# Patient Record
Sex: Female | Born: 1995 | Race: White | Hispanic: No | Marital: Single | Smoking: Never smoker
Health system: Southern US, Community
[De-identification: ages and names within clinical notes are randomized; demographics above are authoritative.]

## PROBLEM LIST (undated history)

## (undated) DIAGNOSIS — G90A Postural orthostatic tachycardia syndrome (POTS): Secondary | ICD-10-CM

## (undated) DIAGNOSIS — D689 Coagulation defect, unspecified: Secondary | ICD-10-CM

## (undated) DIAGNOSIS — I498 Other specified cardiac arrhythmias: Secondary | ICD-10-CM

## (undated) HISTORY — PX: TONSILLECTOMY: SUR1361

---
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
## 2020-01-09 ENCOUNTER — Ambulatory Visit: Payer: Self-pay | Attending: Internal Medicine

## 2020-01-09 DIAGNOSIS — Z23 Encounter for immunization: Secondary | ICD-10-CM

## 2020-01-09 NOTE — Progress Notes (Signed)
   Covid-19 Vaccination Clinic  Name:  Nevaya Nagele    MRN: [id number] DOB: Apr 07, 1996  01/09/2020  Ms. Kanitz was observed post Covid-19 immunization for 30 minutes based on pre-vaccination screening without incident. She was provided with Vaccine Information Sheet and instruction to access the V-Safe system.   Ms. Leugers was instructed to call 911 with any severe reactions post vaccine: Marland Kitchen Difficulty breathing  . Swelling of face and throat  . A fast heartbeat  . A bad rash all over body  . Dizziness and weakness   Immunizations Administered    Name Date Dose VIS Date Route   Pfizer COVID-19 Vaccine 01/09/2020  4:32 PM 0.3 mL 10/16/2018 Intramuscular   Manufacturer: [employer], [employer]   Lot: [id number]   NDC: [address]-3

## 2020-02-03 ENCOUNTER — Ambulatory Visit: Payer: Self-pay

## 2020-02-10 ENCOUNTER — Ambulatory Visit: Payer: BC Managed Care – PPO | Attending: Internal Medicine

## 2020-02-10 DIAGNOSIS — Z23 Encounter for immunization: Secondary | ICD-10-CM

## 2020-02-10 NOTE — Progress Notes (Signed)
   Covid-19 Vaccination Clinic  Name:  Meredith Beasley    MRN: [id number] DOB: May 09, 1996  02/10/2020  [name] was observed post Covid-19 immunization for 15 minutes without incident. She was provided with Vaccine Information Sheet and instruction to access the V-Safe system.   [name] was instructed to call 911 with any severe reactions post vaccine: Marland Kitchen Difficulty breathing  . Swelling of face and throat  . A fast heartbeat  . A bad rash all over body  . Dizziness and weakness   Immunizations Administered    Name Date Dose VIS Date Route   Pfizer COVID-19 Vaccine 02/10/2020  2:05 PM 0.3 mL 10/16/2018 Intramuscular   Manufacturer: [employer], [employer]   Lot: [id number]   NDC: [address]-8

## 2021-03-26 ENCOUNTER — Other Ambulatory Visit: Payer: Self-pay

## 2021-03-26 ENCOUNTER — Encounter (HOSPITAL_BASED_OUTPATIENT_CLINIC_OR_DEPARTMENT_OTHER): Payer: Self-pay | Admitting: *Deleted

## 2021-03-26 ENCOUNTER — Emergency Department (HOSPITAL_BASED_OUTPATIENT_CLINIC_OR_DEPARTMENT_OTHER): Payer: BC Managed Care – PPO

## 2021-03-26 ENCOUNTER — Other Ambulatory Visit (HOSPITAL_BASED_OUTPATIENT_CLINIC_OR_DEPARTMENT_OTHER): Payer: Self-pay

## 2021-03-26 ENCOUNTER — Emergency Department (HOSPITAL_BASED_OUTPATIENT_CLINIC_OR_DEPARTMENT_OTHER)
Admission: EM | Admit: 2021-03-26 | Discharge: 2021-03-26 | Disposition: A | Discharge status: Home / Self Care | Payer: BC Managed Care – PPO | Attending: Emergency Medicine | Admitting: Emergency Medicine

## 2021-03-26 DIAGNOSIS — R079 Chest pain, unspecified: Secondary | ICD-10-CM | POA: Diagnosis present

## 2021-03-26 DIAGNOSIS — U071 COVID-19: Secondary | ICD-10-CM | POA: Insufficient documentation

## 2021-03-26 DIAGNOSIS — Z7901 Long term (current) use of anticoagulants: Secondary | ICD-10-CM | POA: Diagnosis not present

## 2021-03-26 DIAGNOSIS — R072 Precordial pain: Secondary | ICD-10-CM | POA: Insufficient documentation

## 2021-03-26 HISTORY — DX: Coagulation defect, unspecified: D68.9

## 2021-03-26 HISTORY — DX: Other specified cardiac arrhythmias: I49.8

## 2021-03-26 HISTORY — DX: Postural orthostatic tachycardia syndrome (POTS): G90.A

## 2021-03-26 LAB — COMPREHENSIVE METABOLIC PANEL
ALT: 23 U/L (ref 0–44)
AST: 23 U/L (ref 15–41)
Albumin: 4.1 g/dL (ref 3.5–5.0)
Alkaline Phosphatase: 57 U/L (ref 38–126)
Anion gap: 6 (ref 5–15)
BUN: 9 mg/dL (ref 6–20)
CO2: 24 mmol/L (ref 22–32)
Calcium: 9.1 mg/dL (ref 8.9–10.3)
Chloride: 109 mmol/L (ref 98–111)
Creatinine, Ser: 0.91 mg/dL (ref 0.44–1.00)
GFR, Estimated: >60 mL/min (ref >60)
Glucose, Bld: 103 mg/dL — ABNORMAL HIGH (ref 70–99)
Potassium: 3.9 mmol/L (ref 3.5–5.1)
Sodium: 139 mmol/L (ref 135–145)
Total Bilirubin: 0.5 mg/dL (ref 0.3–1.2)
Total Protein: 7.7 g/dL (ref 6.5–8.1)

## 2021-03-26 LAB — CBC WITH DIFFERENTIAL/PLATELET
Abs Immature Granulocytes: 0.05 10*3/uL (ref 0.00–0.07)
Basophils Absolute: 0.1 10*3/uL (ref 0.0–0.1)
Basophils Relative: 1 %
Eosinophils Absolute: 0.2 10*3/uL (ref 0.0–0.5)
Eosinophils Relative: 2 %
HCT: 38.4 % (ref 36.0–46.0)
Hemoglobin: 12.2 g/dL (ref 12.0–15.0)
Immature Granulocytes: 0 %
Lymphocytes Relative: 25 %
Lymphs Abs: 2.9 10*3/uL (ref 0.7–4.0)
MCH: 24.3 pg — ABNORMAL LOW (ref 26.0–34.0)
MCHC: 31.8 g/dL (ref 30.0–36.0)
MCV: 76.3 fL — ABNORMAL LOW (ref 80.0–100.0)
Monocytes Absolute: 1.1 10*3/uL — ABNORMAL HIGH (ref 0.1–1.0)
Monocytes Relative: 10 %
Neutro Abs: 7.2 10*3/uL (ref 1.7–7.7)
Neutrophils Relative %: 62 %
Platelets: 373 10*3/uL (ref 150–400)
RBC: 5.03 MIL/uL (ref 3.87–5.11)
RDW: 15.1 % (ref 11.5–15.5)
WBC: 11.6 10*3/uL — ABNORMAL HIGH (ref 4.0–10.5)
nRBC: 0 % (ref 0.0–0.2)

## 2021-03-26 LAB — HCG, SERUM, QUALITATIVE: Preg, Serum: NEGATIVE

## 2021-03-26 LAB — TROPONIN I (HIGH SENSITIVITY): Troponin I (High Sensitivity): <2 ng/L (ref <18)

## 2021-03-26 MED ORDER — IOHEXOL 350 MG/ML SOLN
100.0000 mL | Freq: Once | INTRAVENOUS | Status: AC | PRN
  Administered 2021-03-26: 70 mL via INTRAVENOUS

## 2021-03-26 MED ORDER — PREDNISONE 10 MG PO TABS
20.0000 mg | ORAL_TABLET | Freq: Every day | ORAL | 0 refills | Status: AC
  Filled 2021-03-26: qty 10, 5d supply, fill #0

## 2021-03-26 NOTE — ED Notes (Signed)
Patient transported to CT 

## 2021-03-26 NOTE — ED Provider Notes (Signed)
[facility] EMERGENCY DEPARTMENT Provider Note   CSN: [id number] Arrival date & time: 03/26/21  1351     History Chest pain   Meredith Beasley is a 25 y.o. 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 Pain x 1 week. Tested positive for covid 2 days ago. No current SOB. Feels anxious. No missed doses of anticoag. No fever, chills, cough, abd pain, unilateral leg swelling redness ,warmth. Denies additional aggravating or alleviating factors.  History obtained from patient and past medical records. No interpretor was used.  HPI     Past Medical History:  Diagnosis Date   Clotting disorder (HCC)    POTS (postural orthostatic tachycardia syndrome)     There are no problems to display for this patient.   Past Surgical History:  Procedure Laterality Date   TONSILLECTOMY       OB History   No obstetric history on file.     No family history on file.  Social History   Tobacco Use   Smoking status: Never   Smokeless tobacco: Never  Vaping Use   Vaping Use: Never used  Substance Use Topics   Alcohol use: Never   Drug use: Never    Home Medications Prior to Admission medications   Medication Sig Start Date End Date Taking? Authorizing Provider  apixaban (ELIQUIS) 5 MG TABS tablet Take by mouth. 01/28/21  Yes [provider]  busPIRone (BUSPAR) 15 MG tablet Take 1 tablet by mouth 2 (two) times daily. 03/11/21  Yes [provider]  escitalopram (LEXAPRO) 20 MG tablet Take by mouth. 07/03/19  Yes [provider]  predniSONE (DELTASONE) 10 MG tablet Take 2 tablets (20 mg total) by mouth daily for 5 days. 03/26/21 03/31/21 Yes Terriann Difonzo A, PA-C  medroxyPROGESTERone Acetate 150 MG/ML SUSY INJECT 1 ML INTRAMUSCULARLY EVERY 3 MONTHS 02/08/21   [provider]    Allergies    Sumatriptan  Review of Systems   Review of Systems   Constitutional: Negative.   HENT: Negative.    Respiratory: Negative.    Cardiovascular:  Positive for chest pain. Negative for palpitations and leg swelling.  Genitourinary: Negative.   Musculoskeletal: Negative.   Skin: Negative.   Neurological: Negative.   All other systems reviewed and are negative.  Physical Exam Updated Vital Signs BP 104/88 (BP Location: Right Arm) Comment: Simultaneous filing. User may not have seen previous data.  Pulse 78 Comment: Simultaneous filing. User may not have seen previous data.  Temp 98.8 F (37.1 C) (Oral)   Resp 17 Comment: Simultaneous filing. User may not have seen previous data.  Ht 5\' 7"  (1.702 m)   Wt 131.5 kg   SpO2 99% Comment: Simultaneous filing. User may not have seen previous data.  BMI 45.42 kg/m   Physical Exam Vitals and nursing note reviewed.  Constitutional:      General: She is not in acute distress.    Appearance: She is well-developed. She is obese. She is not ill-appearing, toxic-appearing or diaphoretic.  HENT:     Head: Normocephalic and atraumatic.     Nose: Nose normal.  Eyes:     Pupils: Pupils are equal, round, and reactive to light.  Cardiovascular:     Rate and Rhythm: Normal rate.     Pulses: Normal pulses.     Heart sounds: Normal heart sounds.  Pulmonary:     Effort: Pulmonary effort is  normal. No respiratory distress.     Breath sounds: Normal breath sounds.  Chest:    Abdominal:     General: Bowel sounds are normal. There is no distension.     Palpations: Abdomen is soft.  Musculoskeletal:        General: No swelling, tenderness, deformity or signs of injury. Normal range of motion.     Cervical back: Normal range of motion.     Right lower leg: No edema.     Left lower leg: No edema.  Skin:    General: Skin is warm and dry.     Capillary Refill: Capillary refill takes less than 2 seconds.  Neurological:     General: No focal deficit present.     Mental Status: She is alert and oriented  to person, place, and time.  Psychiatric:        Mood and Affect: Mood is anxious.    ED Results / Procedures / Treatments   Labs (all labs ordered are listed, but only abnormal results are displayed) Labs Reviewed  CBC WITH DIFFERENTIAL/PLATELET - Abnormal; Notable for the following components:      Result Value   WBC 11.6 (*)    MCV 76.3 (*)    MCH 24.3 (*)    Monocytes Absolute 1.1 (*)    All other components within normal limits  COMPREHENSIVE METABOLIC PANEL - Abnormal; Notable for the following components:   Glucose, Bld 103 (*)    All other components within normal limits  HCG, SERUM, QUALITATIVE  TROPONIN I (HIGH SENSITIVITY)  TROPONIN I (HIGH SENSITIVITY)    EKG EKG Interpretation  Date/Time:  Friday March 26 2021 14:06:34 EDT Ventricular Rate:  90 PR Interval:  138 QRS Duration: 84 QT Interval:  340 QTC Calculation: 415 R Axis:   59 Text Interpretation: Normal sinus rhythm Nonspecific T wave abnormality Abnormal ECG Confirmed by Virgina Norfolk ([id number]) on 03/26/2021 2:07:46 PM  Radiology CT Angio Chest PE W/Cm &/Or Wo Cm  Result Date: 03/26/2021 CLINICAL DATA:  25 year old female with factor 5 Leiden coagulation disorder, COVID positive with pleuritic chest pain. High probability for pulmonary embolus. EXAM: CT ANGIOGRAPHY CHEST WITH CONTRAST TECHNIQUE: Multidetector CT imaging of the chest was performed using the standard protocol during bolus administration of intravenous contrast. Multiplanar CT image reconstructions and MIPs were obtained to evaluate the vascular anatomy. CONTRAST:  69mL OMNIPAQUE IOHEXOL 350 MG/ML SOLN COMPARISON:  None. FINDINGS: Cardiovascular: Satisfactory opacification of the pulmonary arteries to the segmental level. No evidence of pulmonary embolism. Normal heart size. No pericardial effusion. Mediastinum/Nodes: No enlarged mediastinal, hilar, or axillary lymph nodes. Thyroid gland, trachea, and esophagus demonstrate no significant findings.  Lungs/Pleura: Lungs are clear. No pleural effusion or pneumothorax. Upper Abdomen: No acute abnormality. Musculoskeletal: No chest wall abnormality. No acute or significant osseous findings. Review of the MIP images confirms the above findings. IMPRESSION: Negative for acute pulmonary embolus, pneumonia or other acute cardiopulmonary process. Electronically Signed   By: Malachy Moan M.D.   On: 03/26/2021 15:51    Procedures Procedures   Medications Ordered in ED Medications  iohexol (OMNIPAQUE) 350 MG/ML injection 100 mL (70 mLs Intravenous Contrast Given 03/26/21 1519)    ED Course  I have reviewed the triage vital signs and the nursing notes.  Pertinent labs & imaging results that were available during my care of the patient were reviewed by me and considered in my medical decision making (see chart for details).  Here for evaluation of left-sided chest pain  over the last week.  She is afebrile, nonseptic, non-ill-appearing.  History of factor V Leiden. Recent pos COVID 2 days ago. NV intact. Has not missed any doses of anticoag. No obvious DVT on exam. Heart and lungs clear. Abd soft.  Does appear anxious.  Labs and imaging personally reviewed and interpreted: CBC leuks at 11.6 CMP glucose 103 Trop <2 Preg neg EKG without ischemic changes CTA chest without ischemic changes  Patient reassessed.  No respiratory distress.  Appears overall well.  Question costochondritis versus pleurisy.  Reassuring work-up here in ED.  Discussed continued quarantine at home for COVID infection.  Strict return precautions.  Return for new or worsening symptoms.  The patient has been appropriately medically screened and/or stabilized in the ED. I have low suspicion for any other emergent medical condition which would require further screening, evaluation or treatment in the ED or require inpatient management.  Patient is hemodynamically stable and in no acute distress.  Patient able to ambulate in  department prior to ED.  Evaluation does not show acute pathology that would require ongoing or additional emergent interventions while in the emergency department or further inpatient treatment.  I have discussed the diagnosis with the patient and answered all questions.  Pain is been managed while in the emergency department and patient has no further complaints prior to discharge.  Patient is comfortable with plan discussed in room and is stable for discharge at this time.  I have discussed strict return precautions for returning to the emergency department.  Patient was encouraged to follow-up with PCP/specialist refer to at discharge.     MDM Rules/Calculators/A&P                            Final Clinical Impression(s) / ED Diagnoses Final diagnoses:  Precordial pain    Rx / DC Orders ED Discharge Orders          Ordered    predniSONE (DELTASONE) 10 MG tablet  Daily        03/26/21 1719             Darric Plante A, PA-C 03/26/21 1721    Curatolo, Adam, DO 03/27/21 1027

## 2021-03-26 NOTE — ED Notes (Signed)
ED Provider at bedside. 

## 2021-03-26 NOTE — Discharge Instructions (Addendum)
Take the medications as prescribed  Return for new or worsneing symptoms

## 2021-03-26 NOTE — ED Triage Notes (Signed)
She tested positive for Covid 2 days ago. Here today with c.o stabbing chest pain to her left chest.

## 2021-07-09 ENCOUNTER — Other Ambulatory Visit: Payer: Self-pay

## 2021-07-09 ENCOUNTER — Emergency Department (HOSPITAL_BASED_OUTPATIENT_CLINIC_OR_DEPARTMENT_OTHER)
Admission: EM | Admit: 2021-07-09 | Discharge: 2021-07-09 | Disposition: A | Discharge status: Home / Self Care | Payer: BC Managed Care – PPO | Attending: Emergency Medicine | Admitting: Emergency Medicine

## 2021-07-09 ENCOUNTER — Emergency Department (HOSPITAL_BASED_OUTPATIENT_CLINIC_OR_DEPARTMENT_OTHER): Payer: BC Managed Care – PPO

## 2021-07-09 ENCOUNTER — Encounter (HOSPITAL_BASED_OUTPATIENT_CLINIC_OR_DEPARTMENT_OTHER): Payer: Self-pay | Admitting: Emergency Medicine

## 2021-07-09 DIAGNOSIS — R1031 Right lower quadrant pain: Secondary | ICD-10-CM | POA: Diagnosis present

## 2021-07-09 DIAGNOSIS — K760 Fatty (change of) liver, not elsewhere classified: Secondary | ICD-10-CM | POA: Insufficient documentation

## 2021-07-09 DIAGNOSIS — K529 Noninfective gastroenteritis and colitis, unspecified: Secondary | ICD-10-CM | POA: Diagnosis not present

## 2021-07-09 DIAGNOSIS — R161 Splenomegaly, not elsewhere classified: Secondary | ICD-10-CM | POA: Diagnosis not present

## 2021-07-09 DIAGNOSIS — K449 Diaphragmatic hernia without obstruction or gangrene: Secondary | ICD-10-CM | POA: Diagnosis not present

## 2021-07-09 DIAGNOSIS — Z7901 Long term (current) use of anticoagulants: Secondary | ICD-10-CM | POA: Insufficient documentation

## 2021-07-09 LAB — CBC WITH DIFFERENTIAL/PLATELET
Abs Immature Granulocytes: 0.05 10*3/uL (ref 0.00–0.07)
Basophils Absolute: 0.1 10*3/uL (ref 0.0–0.1)
Basophils Relative: 1 %
Eosinophils Absolute: 0.2 10*3/uL (ref 0.0–0.5)
Eosinophils Relative: 2 %
HCT: 38.4 % (ref 36.0–46.0)
Hemoglobin: 11.9 g/dL — ABNORMAL LOW (ref 12.0–15.0)
Immature Granulocytes: 1 %
Lymphocytes Relative: 24 %
Lymphs Abs: 2.6 10*3/uL (ref 0.7–4.0)
MCH: 23.2 pg — ABNORMAL LOW (ref 26.0–34.0)
MCHC: 31 g/dL (ref 30.0–36.0)
MCV: 75 fL — ABNORMAL LOW (ref 80.0–100.0)
Monocytes Absolute: 0.8 10*3/uL (ref 0.1–1.0)
Monocytes Relative: 8 %
Neutro Abs: 7.2 10*3/uL (ref 1.7–7.7)
Neutrophils Relative %: 64 %
Platelets: 324 10*3/uL (ref 150–400)
RBC: 5.12 MIL/uL — ABNORMAL HIGH (ref 3.87–5.11)
RDW: 14.4 % (ref 11.5–15.5)
WBC: 10.9 10*3/uL — ABNORMAL HIGH (ref 4.0–10.5)
nRBC: 0 % (ref 0.0–0.2)

## 2021-07-09 LAB — LIPASE, BLOOD: Lipase: 31 U/L (ref 11–51)

## 2021-07-09 LAB — COMPREHENSIVE METABOLIC PANEL
ALT: 24 U/L (ref 0–44)
AST: 25 U/L (ref 15–41)
Albumin: 4.3 g/dL (ref 3.5–5.0)
Alkaline Phosphatase: 68 U/L (ref 38–126)
Anion gap: 9 (ref 5–15)
BUN: 13 mg/dL (ref 6–20)
CO2: 22 mmol/L (ref 22–32)
Calcium: 9.3 mg/dL (ref 8.9–10.3)
Chloride: 105 mmol/L (ref 98–111)
Creatinine, Ser: 0.8 mg/dL (ref 0.44–1.00)
GFR, Estimated: >60 mL/min (ref >60)
Glucose, Bld: 95 mg/dL (ref 70–99)
Potassium: 3.7 mmol/L (ref 3.5–5.1)
Sodium: 136 mmol/L (ref 135–145)
Total Bilirubin: 0.7 mg/dL (ref 0.3–1.2)
Total Protein: 7.9 g/dL (ref 6.5–8.1)

## 2021-07-09 LAB — PREGNANCY, URINE: Preg Test, Ur: NEGATIVE

## 2021-07-09 MED ORDER — IOHEXOL 300 MG/ML  SOLN
100.0000 mL | Freq: Once | INTRAMUSCULAR | Status: AC | PRN
  Administered 2021-07-09: 100 mL via INTRAVENOUS

## 2021-07-09 MED ORDER — ONDANSETRON HCL 4 MG PO TABS: 4.0000 mg | ORAL_TABLET | ORAL | 0 refills | Status: DC | PRN

## 2021-07-09 MED ORDER — DICYCLOMINE HCL 20 MG PO TABS: 20.0000 mg | ORAL_TABLET | ORAL | 0 refills | Status: AC | PRN

## 2021-07-09 MED ORDER — AMOXICILLIN-POT CLAVULANATE 875-125 MG PO TABS: 1.0000 | ORAL_TABLET | Freq: Two times a day (BID) | ORAL | 0 refills | Status: DC

## 2021-07-09 MED ORDER — SODIUM CHLORIDE 0.9 % IV BOLUS
500.0000 mL | Freq: Once | INTRAVENOUS | Status: DC
Start: 2021-07-09 — End: 2021-07-09

## 2021-07-09 MED ORDER — TRAMADOL HCL 50 MG PO TABS: 50.0000 mg | ORAL_TABLET | ORAL | 0 refills | Status: AC | PRN

## 2021-07-09 MED ORDER — SODIUM CHLORIDE 0.9 % IV BOLUS: 1000.0000 mL | Freq: Once | INTRAVENOUS | Status: DC

## 2021-07-09 NOTE — ED Triage Notes (Signed)
Generalized abdominal pain x 3 days.  Worse in RLQ.  N/V/D x 3 days.  No known fever but some chills.  No dysuria.  Noted some bright red blood in stool.

## 2021-07-09 NOTE — ED Provider Notes (Signed)
[facility] Provider Note   CSN: [id number] Arrival date & time: 07/09/21  0947     History Chief Complaint  Patient presents with   Abdominal Pain    Meredith Beasley is a 25 y.o. female with medical history significant for factor V Leiden currently on Eliquis, who presents to the ED for chief complaint of abdominal pain for 3 days.  Patient states the pain is spread all over her abdomen, but is mostly focused in her right lower quadrant.  She states that the pain is consistent, describes it as sharp and states that lying on her stomach on her right side aggravates the pain.  Patient states her pain is a 5 out of 10.  Patient was diagnosed with appendicitis 2 years ago at a facility in the area, but after being treated with antibiotics the facility decided to forego removing her appendix.  Patient states her last menstrual period was 2 years ago, but adds that this is normal due to her being on Depakote shots.  Patient also adds that for the last month or 2 she has been having blood in her stool.  She states that her stools will be normal, but she will have an excessive amount of blood in her underwear after about an hour.  Patient states that she received a colonoscopy 2 years ago for a similar complaint of blood in her stool, but is unsure of what the colonoscopy revealed or if she received a diagnosis from this. Patient endorses fever, headaches, blood in stool, chills, night sweats, nausea, vomiting, diarrhea, decreased appetite.  Patient also endorses increased urination but states that she feels this is most likely due to her drinking lots of fluids to try and combat the vomiting and diarrhea. Patient denies any recent sexual activity, blood in urine, vaginal discharge, or painful urination.  Patient has family history of IBD, on her mothers side.   Abdominal Pain Associated symptoms: chills, diarrhea, fever, nausea and vomiting   Associated symptoms: no chest  pain, no dysuria, no hematuria, no shortness of breath and no vaginal discharge       Past Medical History:  Diagnosis Date   Clotting disorder (HCC)    POTS (postural orthostatic tachycardia syndrome)     There are no problems to display for this patient.   Past Surgical History:  Procedure Laterality Date   TONSILLECTOMY       OB History   No obstetric history on file.     History reviewed. No pertinent family history.  Social History   Tobacco Use   Smoking status: Never   Smokeless tobacco: Never  Vaping Use   Vaping Use: Never used  Substance Use Topics   Alcohol use: Never   Drug use: Never    Home Medications Prior to Admission medications   Medication Sig Start Date End Date Taking? Authorizing Provider  amoxicillin-clavulanate (AUGMENTIN) 875-125 MG tablet Take 1 tablet by mouth every 12 (twelve) hours. 07/09/21  Yes Azucena Cecil, PA  dicyclomine (BENTYL) 20 MG tablet Take 1 tablet (20 mg total) by mouth as needed for spasms (for stomach cramps). 07/09/21  Yes Azucena Cecil, PA  ondansetron (ZOFRAN) 4 MG tablet Take 1 tablet (4 mg total) by mouth as needed for nausea or vomiting. 07/09/21  Yes Azucena Cecil, PA  traMADol (ULTRAM) 50 MG tablet Take 1 tablet (50 mg total) by mouth as needed (for pain). 07/09/21  Yes Azucena Cecil, PA  apixaban Arne Cleveland)  5 MG TABS tablet Take by mouth. 01/28/21   [provider]  busPIRone (BUSPAR) 15 MG tablet Take 1 tablet by mouth 2 (two) times daily. 03/11/21   [provider]  escitalopram (LEXAPRO) 20 MG tablet Take by mouth. 07/03/19   [provider]  medroxyPROGESTERone Acetate 150 MG/ML SUSY INJECT 1 ML INTRAMUSCULARLY EVERY 3 MONTHS 02/08/21   [provider]    Allergies    Sumatriptan  Review of Systems   Review of Systems  Constitutional:  Positive for chills and fever.  Respiratory:  Negative for shortness of breath.   Cardiovascular:  Negative  for chest pain.  Gastrointestinal:  Positive for abdominal pain, blood in stool, diarrhea, nausea and vomiting.  Genitourinary:  Negative for decreased urine volume, difficulty urinating, dysuria, hematuria and vaginal discharge.  Neurological:  Positive for headaches.  All other systems reviewed and are negative.  Physical Exam Updated Vital Signs BP 119/70 (BP Location: Left Arm)   Pulse 78   Temp 98.7 F (37.1 C) (Oral)   Resp 18   Ht 5\' 7"  (1.702 m)   Wt 136.1 kg   SpO2 100%   BMI 46.99 kg/m   Physical Exam Vitals and nursing note reviewed.  Constitutional:      General: She is not in acute distress.    Appearance: She is not toxic-appearing.  HENT:     Head: Normocephalic.     Mouth/Throat:     Mouth: Mucous membranes are moist.  Eyes:     Extraocular Movements: Extraocular movements intact.     Pupils: Pupils are equal, round, and reactive to light.  Cardiovascular:     Rate and Rhythm: Normal rate and regular rhythm.  Pulmonary:     Effort: Pulmonary effort is normal.     Breath sounds: Normal breath sounds. No wheezing.  Abdominal:     General: Abdomen is flat. Bowel sounds are normal.     Palpations: Abdomen is soft.     Tenderness: There is abdominal tenderness in the right upper quadrant and right lower quadrant. There is no right CVA tenderness or left CVA tenderness. Positive signs include Murphy's sign, Rovsing's sign, McBurney's sign and obturator sign.  Skin:    General: Skin is warm and dry.     Capillary Refill: Capillary refill takes less than 2 seconds.  Neurological:     General: No focal deficit present.     Mental Status: She is alert.    ED Results / Procedures / Treatments   Labs (all labs ordered are listed, but only abnormal results are displayed) Labs Reviewed  CBC WITH DIFFERENTIAL/PLATELET - Abnormal; Notable for the following components:      Result Value   WBC 10.9 (*)    RBC 5.12 (*)    Hemoglobin 11.9 (*)    MCV 75.0 (*)     MCH 23.2 (*)    All other components within normal limits  COMPREHENSIVE METABOLIC PANEL  LIPASE, BLOOD  PREGNANCY, URINE    EKG None  Radiology CT ABDOMEN PELVIS W CONTRAST  Result Date: 07/09/2021 CLINICAL DATA:  Nonlocalized right-sided abdominal pain x3 days with nausea vomiting and diarrhea EXAM: CT ABDOMEN AND PELVIS WITH CONTRAST TECHNIQUE: Multidetector CT imaging of the abdomen and pelvis was performed using the standard protocol following bolus administration of intravenous contrast. CONTRAST:  155mL OMNIPAQUE IOHEXOL 300 MG/ML  SOLN COMPARISON:  MRI lumbar spine December 17, 2019 and chest CT March 26, 2021 FINDINGS: Lower chest: No acute abnormality.  Hepatobiliary: Mild diffuse hepatic steatosis with more focal area of fatty infiltration along the falciform ligament. Gallbladder is unremarkable. No biliary ductal dilation. No portal venous gas. Pancreas: No pancreatic ductal dilation or evidence of acute inflammation. Spleen: Mild splenomegaly measuring 14.8 cm in maximum axial dimension. Adrenals/Urinary Tract: Bilateral adrenal glands are unremarkable. No hydronephrosis. No solid enhancing renal mass. Kidneys demonstrate symmetric enhancement excretion of contrast. Urinary bladder is unremarkable for degree of distension. Stomach/Bowel: No enteric contrast was administered. Small hiatal hernia otherwise the stomach is unremarkable for degree of distension. No pathologic dilation of small or large bowel. Normal appendix. Colonic diverticulosis. Mild wall thickening of the ascending colon. Additionally there is mild wall thickening of predominantly decompressed left-sided colon extending from the splenic flexure to the rectum. No pneumatosis. Vascular/Lymphatic: No abdominal aortic aneurysm. Prominent mesenteric lymph nodes measuring up to 8 mm in short axis right lower quadrant. Reproductive: Uterus and bilateral adnexa are unremarkable. Other: No significant abdominopelvic free fluid.  Musculoskeletal: No acute or significant osseous findings. IMPRESSION: 1. Mild wall thickening of the ascending colon and predominantly decompressed left-sided colon extending from the splenic flexure to the rectum. Findings may represent infectious or inflammatory colitis. 2. Prominent mesenteric lymph nodes measuring up to 8 mm in short axis right lower quadrant, which may be reactive. 3. Normal appendix. 4. Colonic diverticulosis without evidence of acute diverticulitis. 5. Mild diffuse hepatic steatosis. 6. Mild splenomegaly. Electronically Signed   By: Dahlia Bailiff M.D.   On: 07/09/2021 11:29    Procedures Procedures   Medications Ordered in ED Medications  iohexol (OMNIPAQUE) 300 MG/ML solution 100 mL (100 mLs Intravenous Contrast Given 07/09/21 1112)    ED Course  I have reviewed the triage vital signs and the nursing notes.  Pertinent labs & imaging results that were available during my care of the patient were reviewed by me and considered in my medical decision making (see chart for details).    MDM Rules/Calculators/A&P                          25 year old female presents with right upper quadrant and right lower quadrant pain for 3 days.  On examination patient has positive Rovsing sign, McBurney point tenderness, positive obturator sign, and a positive Murphy sign.  Patient states that she has felt feverish but has not recorded a fever in the last couple days, has nausea and vomiting, and has a decreased appetite. The emergent DDX for RUQ/RLQ pain includes but is not limited to Gallbladder disease, appendicitis, IBD, PUD, Acute pancreatitis, pyelonephritis.  On examination patient is afebrile, nonseptic, nontoxic appearing, nonhypoxic.  Lab work ordered and interpreted by myself includes lipase, urine pregnancy test, CMP, CBC with differential.  Patient's lipase is within normal limits, so my suspicion for pancreatitis is low.  Patient urine pregnancy test result is  negative.  Patient comprehensive metabolic panel resulted within normal limits.  Due to this, I made the decision not to treat patient with IV fluids.  CBC was revealing for hemoglobin of 11.9 which is slightly decreased from patient's baseline of 12.2 that was recorded 3 months ago.  Patient denies lightheadedness, dizziness or any recent syncopal events.  Patient stated that during visit today she was not having blood in her stool, and also stated she did not wish to have a rectal examination completed.  Imaging ordered and interpreted by myself include CT abdomen pelvis with contrast.  Results of CT abdomen pelvis with contrast showed  a normal appendix, normal gallbladder so suspicion for appendicitis and cholecystitis is low at this time.   CT is revealing for mild wall thickening of the ascending colon and predominantly decompressed left-sided colon extending from the flexure to the rectum.  According to radiology these findings may be representative of an factious or inflammatory colitis and at this time is the most likely cause of patient's pain and symptoms.  The results of the CT scan were explained to the patient who expressed understanding with her results.  The patient will be given GI follow-up, a prescription of Augmentin to treat for any potential infection, tramadol for pain, Zofran for nausea and Bentyl for any cramping stomach pains.  Return precautions were discussed with patient such as such as excessive blood in stool, lightheadedness, dizziness, weakness or new onset fever.  Patient expresses understanding with return precautions and is agreeable to plan for outpatient GI follow-up. Patient appears stable for discharge at this time.                  Final Clinical Impression(s) / ED Diagnoses Final diagnoses:  Colitis    Rx / DC Orders ED Discharge Orders          Ordered    amoxicillin-clavulanate (AUGMENTIN) 875-125 MG tablet  Every 12 hours         07/09/21 1209    ondansetron (ZOFRAN) 4 MG tablet  As needed        07/09/21 1209    dicyclomine (BENTYL) 20 MG tablet  As needed        07/09/21 1209    traMADol (ULTRAM) 50 MG tablet  As needed        07/09/21 1209             Al Decant, [place] 07/09/21 1858    Vanetta Mulders, MD 07/10/21 [phone]

## 2021-07-09 NOTE — Discharge Instructions (Addendum)
Return to ED with any new or worsening symptoms such as excessive blood in stool, lightheadedness, dizziness or weakness Take Tramadol for pain and ensure that you do not drive or operate machinery while taking this medication Take Bentyl for stomach upset or crampy abdominal pain Take Zofran for nausea Take Augmentin as prescribed Follow up with GI specialist (instructions and number disclosed in this packet) Continue to hydrate using water and electrolyte supplementation beverages such as pedialyte Attempt to utilize the [employer] that is low in fat and grease as discussed

## 2021-07-09 NOTE — ED Notes (Signed)
Patient transported to CT 

## 2021-09-24 ENCOUNTER — Other Ambulatory Visit: Payer: Self-pay

## 2021-09-24 ENCOUNTER — Emergency Department (HOSPITAL_BASED_OUTPATIENT_CLINIC_OR_DEPARTMENT_OTHER): Payer: BC Managed Care – PPO

## 2021-09-24 ENCOUNTER — Emergency Department (HOSPITAL_BASED_OUTPATIENT_CLINIC_OR_DEPARTMENT_OTHER)
Admission: EM | Admit: 2021-09-24 | Discharge: 2021-09-24 | Disposition: A | Discharge status: Home / Self Care | Payer: BC Managed Care – PPO | Attending: Emergency Medicine | Admitting: Emergency Medicine

## 2021-09-24 ENCOUNTER — Encounter (HOSPITAL_BASED_OUTPATIENT_CLINIC_OR_DEPARTMENT_OTHER): Payer: Self-pay | Admitting: *Deleted

## 2021-09-24 DIAGNOSIS — Z20822 Contact with and (suspected) exposure to covid-19: Secondary | ICD-10-CM | POA: Insufficient documentation

## 2021-09-24 DIAGNOSIS — R1031 Right lower quadrant pain: Secondary | ICD-10-CM | POA: Diagnosis not present

## 2021-09-24 DIAGNOSIS — Z7901 Long term (current) use of anticoagulants: Secondary | ICD-10-CM | POA: Diagnosis not present

## 2021-09-24 DIAGNOSIS — R509 Fever, unspecified: Secondary | ICD-10-CM | POA: Insufficient documentation

## 2021-09-24 DIAGNOSIS — R1110 Vomiting, unspecified: Secondary | ICD-10-CM | POA: Diagnosis not present

## 2021-09-24 DIAGNOSIS — R000 Tachycardia, unspecified: Secondary | ICD-10-CM | POA: Diagnosis not present

## 2021-09-24 DIAGNOSIS — R197 Diarrhea, unspecified: Secondary | ICD-10-CM | POA: Insufficient documentation

## 2021-09-24 DIAGNOSIS — R1013 Epigastric pain: Secondary | ICD-10-CM | POA: Insufficient documentation

## 2021-09-24 LAB — RESP PANEL BY RT-PCR (FLU A&B, COVID) ARPGX2
Influenza A by PCR: NEGATIVE
Influenza B by PCR: NEGATIVE
SARS Coronavirus 2 by RT PCR: NEGATIVE

## 2021-09-24 LAB — LIPASE, BLOOD: Lipase: 29 U/L (ref 11–51)

## 2021-09-24 LAB — URINALYSIS, ROUTINE W REFLEX MICROSCOPIC
Glucose, UA: NEGATIVE mg/dL
Hgb urine dipstick: NEGATIVE
Ketones, ur: >=80 mg/dL — AB
Leukocytes,Ua: NEGATIVE
Nitrite: NEGATIVE
Protein, ur: NEGATIVE mg/dL
Specific Gravity, Urine: 1.02 (ref 1.005–1.030)
pH: 7.5 (ref 5.0–8.0)

## 2021-09-24 LAB — COMPREHENSIVE METABOLIC PANEL
ALT: 19 U/L (ref 0–44)
AST: 22 U/L (ref 15–41)
Albumin: 4.3 g/dL (ref 3.5–5.0)
Alkaline Phosphatase: 68 U/L (ref 38–126)
Anion gap: 10 (ref 5–15)
BUN: 9 mg/dL (ref 6–20)
CO2: 19 mmol/L — ABNORMAL LOW (ref 22–32)
Calcium: 9.1 mg/dL (ref 8.9–10.3)
Chloride: 107 mmol/L (ref 98–111)
Creatinine, Ser: 0.72 mg/dL (ref 0.44–1.00)
GFR, Estimated: >60 mL/min (ref >60)
Glucose, Bld: 106 mg/dL — ABNORMAL HIGH (ref 70–99)
Potassium: 3.5 mmol/L (ref 3.5–5.1)
Sodium: 136 mmol/L (ref 135–145)
Total Bilirubin: 1.6 mg/dL — ABNORMAL HIGH (ref 0.3–1.2)
Total Protein: 7.8 g/dL (ref 6.5–8.1)

## 2021-09-24 LAB — CBC WITH DIFFERENTIAL/PLATELET
Abs Immature Granulocytes: 0.02 10*3/uL (ref 0.00–0.07)
Basophils Absolute: 0 10*3/uL (ref 0.0–0.1)
Basophils Relative: 0 %
Eosinophils Absolute: 0.1 10*3/uL (ref 0.0–0.5)
Eosinophils Relative: 1 %
HCT: 38.1 % (ref 36.0–46.0)
Hemoglobin: 12.1 g/dL (ref 12.0–15.0)
Immature Granulocytes: 0 %
Lymphocytes Relative: 7 %
Lymphs Abs: 0.6 10*3/uL — ABNORMAL LOW (ref 0.7–4.0)
MCH: 23.1 pg — ABNORMAL LOW (ref 26.0–34.0)
MCHC: 31.8 g/dL (ref 30.0–36.0)
MCV: 72.8 fL — ABNORMAL LOW (ref 80.0–100.0)
Monocytes Absolute: 0.6 10*3/uL (ref 0.1–1.0)
Monocytes Relative: 8 %
Neutro Abs: 6.5 10*3/uL (ref 1.7–7.7)
Neutrophils Relative %: 84 %
Platelets: 269 10*3/uL (ref 150–400)
RBC: 5.23 MIL/uL — ABNORMAL HIGH (ref 3.87–5.11)
RDW: 15.9 % — ABNORMAL HIGH (ref 11.5–15.5)
WBC: 7.8 10*3/uL (ref 4.0–10.5)
nRBC: 0 % (ref 0.0–0.2)

## 2021-09-24 LAB — HCG, QUANTITATIVE, PREGNANCY: hCG, Beta Chain, Quant, S: <1 m[IU]/mL (ref <5)

## 2021-09-24 MED ORDER — IOHEXOL 300 MG/ML  SOLN
100.0000 mL | Freq: Once | INTRAMUSCULAR | Status: AC | PRN
  Administered 2021-09-24: 100 mL via INTRAVENOUS

## 2021-09-24 MED ORDER — SODIUM CHLORIDE 0.9 % IV BOLUS
500.0000 mL | Freq: Once | INTRAVENOUS | Status: AC
Start: 2021-09-24 — End: 2021-09-24
  Administered 2021-09-24: 500 mL via INTRAVENOUS

## 2021-09-24 MED ORDER — AMOXICILLIN-POT CLAVULANATE 875-125 MG PO TABS: 1.0000 | ORAL_TABLET | Freq: Two times a day (BID) | ORAL | 0 refills | Status: AC

## 2021-09-24 MED ORDER — SODIUM CHLORIDE 0.9 % IV BOLUS
1000.0000 mL | Freq: Once | INTRAVENOUS | Status: AC
  Administered 2021-09-24: 1000 mL via INTRAVENOUS

## 2021-09-24 MED ORDER — FAMOTIDINE IN NACL 20-0.9 MG/50ML-% IV SOLN
20.0000 mg | Freq: Once | INTRAVENOUS | Status: AC
  Administered 2021-09-24: 20 mg via INTRAVENOUS
  Filled 2021-09-24: qty 50

## 2021-09-24 MED ORDER — AMOXICILLIN-POT CLAVULANATE 875-125 MG PO TABS
1.0000 | ORAL_TABLET | Freq: Once | ORAL | Status: AC
  Administered 2021-09-24: 1 via ORAL
  Filled 2021-09-24: qty 1

## 2021-09-24 MED ORDER — METOCLOPRAMIDE HCL 5 MG/ML IJ SOLN
10.0000 mg | Freq: Once | INTRAMUSCULAR | Status: AC
  Administered 2021-09-24: 10 mg via INTRAVENOUS
  Filled 2021-09-24: qty 2

## 2021-09-24 MED ORDER — PROMETHAZINE HCL 25 MG PO TABS: 25.0000 mg | ORAL_TABLET | Freq: Four times a day (QID) | ORAL | 0 refills | Status: AC | PRN

## 2021-09-24 MED ORDER — ACETAMINOPHEN 325 MG PO TABS
650.0000 mg | ORAL_TABLET | Freq: Once | ORAL | Status: AC
  Administered 2021-09-24: 650 mg via ORAL
  Filled 2021-09-24: qty 2

## 2021-09-24 NOTE — Discharge Instructions (Addendum)
Take the Phenergan as needed for nausea.  Start taking the antibiotic to help with your symptoms. Follow-up with your GI specialist. Return to the ER if you start to experience worsening symptoms, bloody stools, lightheadedness or severe pain.

## 2021-09-24 NOTE — ED Notes (Signed)
Patient transported to CT 

## 2021-09-24 NOTE — ED Provider Notes (Signed)
[facility] Provider Note   CSN: [id number] Arrival date & time: 09/24/21  1639     History  Chief Complaint  Patient presents with   Emesis   Diarrhea   Fever    Meredith Beasley is a 26 y.o. 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  She is had intermittent similar episodes like this since November 2022 and has been diagnosed with colitis which will improve with antibiotics.  States that she does not feel that she is able to get her fever controlled with Tylenol that she has been taking.  She has not been keep down any antiemetic that she has at home.  Reports right lower and mid abdominal pain as well as epigastric pain since yesterday.  No sick contacts that she is aware of.  No urinary symptoms, back pain, cough, congestion, rhinorrhea.  No vaginal complaints.  She is scheduled for colonoscopy and endoscopy soon.  She last had this done 2 years ago without any abnormal findings other than concern for reflux.   Emesis Associated symptoms: diarrhea and fever   Associated symptoms: no abdominal pain, no chills, no cough, no myalgias and no sore throat   Diarrhea Associated symptoms: fever and vomiting   Associated symptoms: no abdominal pain, no chills and no myalgias   Fever Associated symptoms: diarrhea and vomiting   Associated symptoms: no chest pain, no chills, no cough, no dysuria, no ear pain, no myalgias, no nausea, no rash, no rhinorrhea and no sore throat       Home Medications Prior to Admission medications   Medication Sig Start Date End Date Taking? Authorizing Provider  amoxicillin-clavulanate (AUGMENTIN) 875-125 MG tablet Take 1 tablet by mouth every 12 (twelve) hours. 09/24/21  Yes Dorann Davidson, PA-C  promethazine (PHENERGAN) 25 MG tablet Take 1 tablet (25 mg total) by mouth every 6 (six) hours as needed for  nausea or vomiting. 09/24/21  Yes Nyshaun Standage, PA-C  apixaban (ELIQUIS) 5 MG TABS tablet Take by mouth. 01/28/21   [provider]  busPIRone (BUSPAR) 15 MG tablet Take 1 tablet by mouth 2 (two) times daily. 03/11/21   [provider]  dicyclomine (BENTYL) 20 MG tablet Take 1 tablet (20 mg total) by mouth as needed for spasms (for stomach cramps). 07/09/21   Azucena Cecil, PA-C  escitalopram (LEXAPRO) 20 MG tablet Take by mouth. 07/03/19   [provider]  medroxyPROGESTERone Acetate 150 MG/ML SUSY INJECT 1 ML INTRAMUSCULARLY EVERY 3 MONTHS 02/08/21   [provider]  ondansetron (ZOFRAN) 4 MG tablet Take 1 tablet (4 mg total) by mouth as needed for nausea or vomiting. 07/09/21   Azucena Cecil, PA-C  traMADol (ULTRAM) 50 MG tablet Take 1 tablet (50 mg total) by mouth as needed (for pain). 07/09/21   Azucena Cecil, PA-C      Allergies    Sumatriptan    Review of Systems   Review of Systems  Constitutional:  Positive for fever. Negative for appetite change and chills.  HENT:  Negative for ear pain, rhinorrhea, sneezing and sore throat.   Eyes:  Negative for photophobia and visual disturbance.  Respiratory:  Negative for cough, chest tightness, shortness of breath and wheezing.   Cardiovascular:  Negative for chest pain and palpitations.  Gastrointestinal:  Positive for diarrhea and vomiting. Negative for abdominal pain, blood in stool, constipation  and nausea.  Genitourinary:  Negative for dysuria, hematuria and urgency.  Musculoskeletal:  Negative for myalgias.  Skin:  Negative for rash.  Neurological:  Negative for dizziness, weakness and light-headedness.   Physical Exam Updated Vital Signs BP 101/62    Pulse 97    Temp 100.3 F (37.9 C) (Oral)    Resp 20    Ht 5\' 7"  (1.702 m)    Wt (!) 136.1 kg    SpO2 99%    BMI 46.99 kg/m  Physical Exam Vitals and nursing note reviewed.  Constitutional:      General: She is not in acute  distress.    Appearance: She is well-developed.  HENT:     Head: Normocephalic and atraumatic.     Nose: Nose normal.  Eyes:     General: No scleral icterus.       Right eye: No discharge.        Left eye: No discharge.     Conjunctiva/sclera: Conjunctivae normal.  Cardiovascular:     Rate and Rhythm: Regular rhythm. Tachycardia present.     Heart sounds: Normal heart sounds. No murmur heard.   No friction rub. No gallop.  Pulmonary:     Effort: Pulmonary effort is normal. No respiratory distress.     Breath sounds: Normal breath sounds.  Abdominal:     General: Bowel sounds are normal. There is no distension.     Palpations: Abdomen is soft.     Tenderness: There is abdominal tenderness (Right lower, right middle and epigastric). There is no guarding.  Musculoskeletal:        General: Normal range of motion.     Cervical back: Normal range of motion and neck supple.  Skin:    General: Skin is warm and dry.     Findings: No rash.  Neurological:     Mental Status: She is alert.     Motor: No abnormal muscle tone.     Coordination: Coordination normal.    ED Results / Procedures / Treatments   Labs (all labs ordered are listed, but only abnormal results are displayed) Labs Reviewed  COMPREHENSIVE METABOLIC PANEL - Abnormal; Notable for the following components:      Result Value   CO2 19 (*)    Glucose, Bld 106 (*)    Total Bilirubin 1.6 (*)    All other components within normal limits  CBC WITH DIFFERENTIAL/PLATELET - Abnormal; Notable for the following components:   RBC 5.23 (*)    MCV 72.8 (*)    MCH 23.1 (*)    RDW 15.9 (*)    Lymphs Abs 0.6 (*)    All other components within normal limits  URINALYSIS, ROUTINE W REFLEX MICROSCOPIC - Abnormal; Notable for the following components:   Bilirubin Urine SMALL (*)    Ketones, ur >=80 (*)    All other components within normal limits  RESP PANEL BY RT-PCR (FLU A&B, COVID) ARPGX2  LIPASE, BLOOD  HCG, QUANTITATIVE,  PREGNANCY    EKG None  Radiology CT ABDOMEN PELVIS W CONTRAST  Result Date: 09/24/2021 CLINICAL DATA:  Right lower quadrant pain.  Diarrhea and vomiting. EXAM: CT ABDOMEN AND PELVIS WITH CONTRAST TECHNIQUE: Multidetector CT imaging of the abdomen and pelvis was performed using the standard protocol following bolus administration of intravenous contrast. RADIATION DOSE REDUCTION: This exam was performed according to the departmental dose-optimization program which includes automated exposure control, adjustment of the mA and/or kV according to patient size and/or use of iterative  reconstruction technique. CONTRAST:  145mL OMNIPAQUE IOHEXOL 300 MG/ML  SOLN COMPARISON:  Abdominopelvic CT 07/09/2021 FINDINGS: Lower chest: No acute airspace disease or pleural effusion. Hepatobiliary: Fatty infiltration adjacent to the falciform ligament. Mild diffuse hepatic steatosis again seen. No suspicious liver lesion. Gallbladder physiologically distended, no calcified stone. No biliary dilatation. Pancreas: No ductal dilatation or inflammation. Spleen: Again seen splenomegaly, spleen measures 14.7 x 7.3 x 12.1 cm (volume = 680 cm^3). Splenule adjacent to the anteromedial spleen. Adrenals/Urinary Tract: Normal adrenal glands. No hydronephrosis or perinephric edema. No visualized renal calculi. The urinary bladder is completely nondistended and not well assessed. Stomach/Bowel: Detailed bowel assessment is limited in the absence of enteric contrast. The stomach is nondistended. There is no small bowel obstruction or inflammatory change. Occasional fluid-filled loops of small bowel in the pelvis. There is fluid within the ascending and rectosigmoid colon. Intervening colon is nondistended. No definite colonic wall thickening. Normal appendix, best appreciated on coronal reformat series 5 images 50 through 54. No terminal ileal inflammation. Diverticulosis on prior exam not well demonstrated. Vascular/Lymphatic: Few persistent  prominent ileocolic nodes, greatest 8 mm. Overall mild decrease in mesenteric lymph nodes from prior exam. No new or progressive adenopathy. Normal caliber abdominal aorta. Patent portal vein. No portal venous or mesenteric gas. Reproductive: Anteverted uterus. Normal appearance of the ovaries. No adnexal mass. Other: No ascites, free air, or focal fluid collection. Musculoskeletal: There are no acute or suspicious osseous abnormalities. IMPRESSION: 1. Normal appendix. 2. Fluid-filled loops of small bowel in the pelvis, fluid within the ascending and rectosigmoid colon. Findings are typical of diarrheal illness. The colonic wall thickening on prior exam has improved. 3. Persistent but improving prominent mesenteric lymph nodes. 4. Unchanged splenomegaly. Mild hepatic steatosis. Electronically Signed   By: Keith Rake M.D.   On: 09/24/2021 18:54    Procedures Procedures    Medications Ordered in ED Medications  acetaminophen (TYLENOL) tablet 650 mg (650 mg Oral Given 09/24/21 1700)  sodium chloride 0.9 % bolus 1,000 mL (1,000 mLs Intravenous New Bag/Given 09/24/21 1726)  metoCLOPramide (REGLAN) injection 10 mg (10 mg Intravenous Given 09/24/21 1730)  famotidine (PEPCID) IVPB 20 mg premix (0 mg Intravenous Stopped 09/24/21 1825)  sodium chloride 0.9 % bolus 500 mL (500 mLs Intravenous New Bag/Given 09/24/21 1825)  iohexol (OMNIPAQUE) 300 MG/ML solution 100 mL (100 mLs Intravenous Contrast Given 09/24/21 1831)  amoxicillin-clavulanate (AUGMENTIN) 875-125 MG per tablet 1 tablet (1 tablet Oral Given 09/24/21 1906)    ED Course/ Medical Decision Making/ A&P Clinical Course as of 09/24/21 1908  Fri Sep 24, 2021  1735 WBC: 7.8 [HK]  1736 Hemoglobin: 12.1 [HK]  1806 Pulse Rate: 99 [HK]  1806 Resp Panel by RT-PCR (Flu A&B, Covid) Nasopharyngeal Swab [HK]  1809 Tachycardia improved with fluids and I feel that this was related to her fever. [HK]  1825 HCG, Beta Chain, Quant, S: <1 [HK]  1834 Temp: 100.3 F  (37.9 C) [HK]  1835 Ketones, ur(!): >=80 [HK]  1849 Leukocytes,Ua: NEGATIVE [HK]  1849 Nitrite: NEGATIVE [HK]  1849 Temp: 100.3 F (37.9 C) [HK]    Clinical Course User Index [HK] Delia Heady, PA-C                           Medical Decision Making Amount and/or Complexity of Data Reviewed Labs: ordered. Decision-making details documented in ED Course. Radiology: ordered.  Risk OTC drugs. Prescription drug management.   26 year old female presenting to the ED with  fever, abdominal pain, diarrhea, nonbloody, nonbilious emesis since yesterday.  Chart review shows that she has had intermittent similar symptoms to this since November 2022.  States that her symptoms will improve on antibiotics which she is had to take several times for the past few months.  She is scheduled to see GI next week for colonoscopy and endoscopy.  She last had this done 2 years ago without any abnormal findings.  She does not feel that she can control her fever with Tylenol.  She is unable to keep her antiemetic down due to persistent vomiting.  On exam she has right-sided abdominal tenderness without rebound or guarding.  She also complains of epigastric pain.  She is tachycardic and febrile upon arrival and she was given antipyretic in triage.  Labs reviewed by me.  CBC, CMP are unremarkable.  Lipase is normal.  Respiratory panel is negative pregnancy test is negative.  Her tachycardia improved with IV fluids and her fever has improved as well with antipyretic use.  She otherwise is hemodynamically stable as well.  CT scan shows findings consistent with diarrheal illness.  Her inflammatory changes have improved.  However due to her history and her fever here without any known source we will treat with antibiotics for colitis.  She is able to tolerate p.o. intake without difficulty here.  Her blood pressure and tachycardia have improved as well as her fever with antipyretic.  I encouraged her to continue seeing GI for  colonoscopy next week as previously scheduled.  We will treat with Augmentin and will give Phenergan to take at home which she states typically works for her nausea.  Patient is agreeable to the plan.  I suspect this is the cause of her symptoms today.  Return precautions given.   Patient is hemodynamically stable, in NAD, and able to ambulate in the ED. Evaluation does not show pathology that would require ongoing emergent intervention or inpatient treatment. I explained the diagnosis to the patient. Pain has been managed and has no complaints prior to discharge. Patient is comfortable with above plan and is stable for discharge at this time. All questions were answered prior to disposition. Strict return precautions for returning to the ED were discussed. Encouraged follow up with PCP.   An After Visit Summary was printed and given to the patient.   Portions of this note were generated with [employer]. Dictation errors may occur despite best attempts at proofreading.        Final Clinical Impression(s) / ED Diagnoses Final diagnoses:  Diarrhea of presumed infectious origin    Rx / DC Orders ED Discharge Orders          Ordered    promethazine (PHENERGAN) 25 MG tablet  Every 6 hours PRN        09/24/21 1905    amoxicillin-clavulanate (AUGMENTIN) 875-125 MG tablet  Every 12 hours        09/24/21 1905              Delia Heady, PA-C 09/24/21 1908    Lorelle Gibbs, DO 09/24/21 2318

## 2021-09-24 NOTE — ED Triage Notes (Signed)
Abdominal pain, diarrhea and vomiting since yesterday. She is scheduled for a colonoscopy for next week. She also has a fever, headache, body aches.

## 2021-09-24 NOTE — ED Notes (Signed)
Discharge instructions discussed with pt. Pt verbalized understanding. Pt stable and ambulatory.  °

## 2021-09-26 ENCOUNTER — Encounter (HOSPITAL_COMMUNITY): Payer: Self-pay | Admitting: Emergency Medicine

## 2021-09-26 ENCOUNTER — Emergency Department (HOSPITAL_COMMUNITY)
Admission: EM | Admit: 2021-09-26 | Discharge: 2021-09-26 | Disposition: A | Discharge status: Home / Self Care | Payer: BC Managed Care – PPO | Attending: Emergency Medicine | Admitting: Emergency Medicine

## 2021-09-26 ENCOUNTER — Other Ambulatory Visit: Payer: Self-pay

## 2021-09-26 DIAGNOSIS — Z7901 Long term (current) use of anticoagulants: Secondary | ICD-10-CM | POA: Diagnosis not present

## 2021-09-26 DIAGNOSIS — R109 Unspecified abdominal pain: Secondary | ICD-10-CM | POA: Diagnosis present

## 2021-09-26 DIAGNOSIS — R112 Nausea with vomiting, unspecified: Secondary | ICD-10-CM | POA: Insufficient documentation

## 2021-09-26 DIAGNOSIS — R1084 Generalized abdominal pain: Secondary | ICD-10-CM

## 2021-09-26 DIAGNOSIS — E860 Dehydration: Secondary | ICD-10-CM | POA: Diagnosis not present

## 2021-09-26 DIAGNOSIS — R197 Diarrhea, unspecified: Secondary | ICD-10-CM

## 2021-09-26 LAB — CBC WITH DIFFERENTIAL/PLATELET
Abs Immature Granulocytes: 0.03 10*3/uL (ref 0.00–0.07)
Basophils Absolute: 0 10*3/uL (ref 0.0–0.1)
Basophils Relative: 0 %
Eosinophils Absolute: 0.1 10*3/uL (ref 0.0–0.5)
Eosinophils Relative: 2 %
HCT: 42 % (ref 36.0–46.0)
Hemoglobin: 13.1 g/dL (ref 12.0–15.0)
Immature Granulocytes: 0 %
Lymphocytes Relative: 27 %
Lymphs Abs: 1.9 10*3/uL (ref 0.7–4.0)
MCH: 23.9 pg — ABNORMAL LOW (ref 26.0–34.0)
MCHC: 31.2 g/dL (ref 30.0–36.0)
MCV: 76.6 fL — ABNORMAL LOW (ref 80.0–100.0)
Monocytes Absolute: 0.8 10*3/uL (ref 0.1–1.0)
Monocytes Relative: 12 %
Neutro Abs: 4.2 10*3/uL (ref 1.7–7.7)
Neutrophils Relative %: 59 %
Platelets: 254 10*3/uL (ref 150–400)
RBC: 5.48 MIL/uL — ABNORMAL HIGH (ref 3.87–5.11)
RDW: 16.1 % — ABNORMAL HIGH (ref 11.5–15.5)
WBC: 7 10*3/uL (ref 4.0–10.5)
nRBC: 0 % (ref 0.0–0.2)

## 2021-09-26 LAB — URINALYSIS, ROUTINE W REFLEX MICROSCOPIC
Bilirubin Urine: NEGATIVE
Bilirubin Urine: NEGATIVE
Glucose, UA: NEGATIVE mg/dL
Glucose, UA: NEGATIVE mg/dL
Ketones, ur: NEGATIVE mg/dL
Ketones, ur: 15 mg/dL — AB
Leukocytes,Ua: NEGATIVE
Leukocytes,Ua: NEGATIVE
Nitrite: NEGATIVE
Nitrite: NEGATIVE
Protein, ur: NEGATIVE mg/dL
Protein, ur: NEGATIVE mg/dL
Specific Gravity, Urine: 1.025 (ref 1.005–1.030)
Specific Gravity, Urine: 1.01 (ref 1.005–1.030)
pH: 6 (ref 5.0–8.0)
pH: 6 (ref 5.0–8.0)

## 2021-09-26 LAB — COMPREHENSIVE METABOLIC PANEL
ALT: 27 U/L (ref 0–44)
AST: 31 U/L (ref 15–41)
Albumin: 4.1 g/dL (ref 3.5–5.0)
Alkaline Phosphatase: 71 U/L (ref 38–126)
Anion gap: 9 (ref 5–15)
BUN: 6 mg/dL (ref 6–20)
CO2: 19 mmol/L — ABNORMAL LOW (ref 22–32)
Calcium: 9.3 mg/dL (ref 8.9–10.3)
Chloride: 111 mmol/L (ref 98–111)
Creatinine, Ser: 0.85 mg/dL (ref 0.44–1.00)
GFR, Estimated: >60 mL/min (ref >60)
Glucose, Bld: 96 mg/dL (ref 70–99)
Potassium: 3.5 mmol/L (ref 3.5–5.1)
Sodium: 139 mmol/L (ref 135–145)
Total Bilirubin: 0.7 mg/dL (ref 0.3–1.2)
Total Protein: 7.5 g/dL (ref 6.5–8.1)

## 2021-09-26 LAB — URINALYSIS, MICROSCOPIC (REFLEX): Bacteria, UA: NONE SEEN

## 2021-09-26 LAB — LIPASE, BLOOD: Lipase: 37 U/L (ref 11–51)

## 2021-09-26 MED ORDER — PROCHLORPERAZINE EDISYLATE 10 MG/2ML IJ SOLN
5.0000 mg | Freq: Once | INTRAMUSCULAR | Status: AC
  Administered 2021-09-26: 5 mg via INTRAVENOUS
  Filled 2021-09-26: qty 2

## 2021-09-26 MED ORDER — LACTATED RINGERS IV BOLUS
1000.0000 mL | Freq: Once | INTRAVENOUS | Status: AC
  Administered 2021-09-26: 1000 mL via INTRAVENOUS

## 2021-09-26 MED ORDER — ONDANSETRON HCL 4 MG/2ML IJ SOLN
4.0000 mg | Freq: Once | INTRAMUSCULAR | Status: AC
  Administered 2021-09-26: 4 mg via INTRAVENOUS
  Filled 2021-09-26: qty 2

## 2021-09-26 MED ORDER — FENTANYL CITRATE PF 50 MCG/ML IJ SOSY
50.0000 ug | PREFILLED_SYRINGE | Freq: Once | INTRAMUSCULAR | Status: AC
  Administered 2021-09-26: 50 ug via INTRAVENOUS
  Filled 2021-09-26: qty 1

## 2021-09-26 MED ORDER — SODIUM CHLORIDE 0.9 % IV BOLUS
1000.0000 mL | Freq: Once | INTRAVENOUS | Status: AC
  Administered 2021-09-26: 1000 mL via INTRAVENOUS

## 2021-09-26 MED ORDER — ONDANSETRON HCL 4 MG PO TABS
4.0000 mg | ORAL_TABLET | Freq: Three times a day (TID) | ORAL | 0 refills | Status: AC | PRN
  Filled 2021-09-26: qty 12, 4d supply, fill #0

## 2021-09-26 MED ORDER — PROCHLORPERAZINE MALEATE 10 MG PO TABS
10.0000 mg | ORAL_TABLET | Freq: Two times a day (BID) | ORAL | 0 refills | Status: AC | PRN
  Filled 2021-09-26: qty 10, 5d supply, fill #0

## 2021-09-26 NOTE — ED Notes (Signed)
Pt given ginger ale.  Will continue to monitor.  

## 2021-09-26 NOTE — Discharge Instructions (Addendum)
Continue the antibiotics.  Take the nausea medicines to try and keep yourself hydrated.  Your lab work was reassuring today.  Take either the Compazine or the Phenergan but do not take both.

## 2021-09-26 NOTE — ED Triage Notes (Signed)
Patient c/o right upper quadrant abdominal pain onset of Thursday. Was seen at [facility] and given antibiotics recently for diarrhea and vomiting. Patient states symptoms have not improved and pain has worsened. Denies any urinary symptoms.

## 2021-09-26 NOTE — ED Notes (Signed)
Pt able to tolerate drinking fluids

## 2021-09-27 ENCOUNTER — Other Ambulatory Visit (HOSPITAL_BASED_OUTPATIENT_CLINIC_OR_DEPARTMENT_OTHER): Payer: Self-pay

## 2021-09-28 NOTE — ED Provider Notes (Signed)
[facility] EMERGENCY DEPARTMENT Provider Note   CSN: [id number] Arrival date & time: 09/26/21  1409     History  Chief Complaint  Patient presents with   Abdominal Pain    Meredith Beasley is a 26 y.o. female.   Abdominal Pain Associated symptoms: diarrhea, nausea and vomiting   Associated symptoms: no chest pain and no shortness of breath   Patient presents abdominal pain.  Has had for a few months now.  Has been diagnosed potentially with irritable bowel by GI.  Seen in the ER 2 days prior with CT scan that showed resolution of previous colitis.  Had been treated with antibiotics at that time however due to fevers   Past Medical History:  Diagnosis Date   Clotting disorder ([place])    POTS (postural orthostatic tachycardia syndrome)    Past Surgical History:  Procedure Laterality Date   TONSILLECTOMY       Home Medications Prior to Admission medications   Medication Sig Start Date End Date Taking? Authorizing Provider  prochlorperazine (COMPAZINE) 10 MG tablet Take 1 tablet (10 mg total) by mouth 2 (two) times daily as needed for nausea or vomiting. 09/26/21  Yes Davonna Belling, MD  amoxicillin-clavulanate (AUGMENTIN) 875-125 MG tablet Take 1 tablet by mouth every 12 (twelve) hours. 09/24/21   Khatri, Hina, PA-C  apixaban (ELIQUIS) 5 MG TABS tablet Take by mouth. 01/28/21   [provider]  busPIRone (BUSPAR) 15 MG tablet Take 1 tablet by mouth 2 (two) times daily. 03/11/21   [provider]  dicyclomine (BENTYL) 20 MG tablet Take 1 tablet (20 mg total) by mouth as needed for spasms (for stomach cramps). 07/09/21   Azucena Cecil, PA-C  escitalopram (LEXAPRO) 20 MG tablet Take by mouth. 07/03/19   [provider]  medroxyPROGESTERone Acetate 150 MG/ML SUSY INJECT 1 ML INTRAMUSCULARLY EVERY 3 MONTHS 02/08/21   [provider]  ondansetron (ZOFRAN) 4 MG tablet Take 1 tablet (4 mg total) by mouth every 8 (eight) hours as  needed for nausea or vomiting. 09/26/21   Davonna Belling, MD  promethazine (PHENERGAN) 25 MG tablet Take 1 tablet (25 mg total) by mouth every 6 (six) hours as needed for nausea or vomiting. 09/24/21   Khatri, Hina, PA-C  traMADol (ULTRAM) 50 MG tablet Take 1 tablet (50 mg total) by mouth as needed (for pain). 07/09/21   Azucena Cecil, PA-C      Allergies    Sumatriptan    Review of Systems   Review of Systems  Constitutional:  Negative for appetite change.  HENT:  Negative for congestion.   Respiratory:  Negative for shortness of breath.   Cardiovascular:  Negative for chest pain.  Gastrointestinal:  Positive for abdominal pain, diarrhea, nausea and vomiting.  Genitourinary:  Negative for flank pain.  Musculoskeletal:  Negative for back pain.  Skin:  Negative for rash.  Neurological:  Negative for weakness.   Physical Exam Updated Vital Signs BP 123/88 (BP Location: Right Arm)    Pulse 82    Temp 98.7 F (37.1 C) (Oral)    Resp 20    SpO2 100%  Physical Exam Vitals and nursing note reviewed.  Cardiovascular:     Rate and Rhythm: Normal rate.  Abdominal:     Tenderness: There is abdominal tenderness.     Comments: Mild abdominal tenderness no rebound or guarding.  No hernia palpated.  Neurological:     Mental Status: She is alert.    ED  Results / Procedures / Treatments   Labs (all labs ordered are listed, but only abnormal results are displayed) Labs Reviewed  URINALYSIS, ROUTINE W REFLEX MICROSCOPIC - Abnormal; Notable for the following components:      Result Value   Hgb urine dipstick MODERATE (*)    All other components within normal limits  COMPREHENSIVE METABOLIC PANEL - Abnormal; Notable for the following components:   CO2 19 (*)    All other components within normal limits  CBC WITH DIFFERENTIAL/PLATELET - Abnormal; Notable for the following components:   RBC 5.48 (*)    MCV 76.6 (*)    MCH 23.9 (*)    RDW 16.1 (*)    All other components within  normal limits  URINALYSIS, MICROSCOPIC (REFLEX) - Abnormal; Notable for the following components:   Bacteria, UA MANY (*)    All other components within normal limits  URINALYSIS, ROUTINE W REFLEX MICROSCOPIC - Abnormal; Notable for the following components:   Hgb urine dipstick TRACE (*)    Ketones, ur 15 (*)    All other components within normal limits  LIPASE, BLOOD  URINALYSIS, MICROSCOPIC (REFLEX)    EKG None  Radiology No results found.  Procedures Procedures    Medications Ordered in ED Medications  lactated ringers bolus 1,000 mL (0 mLs Intravenous Stopped 09/26/21 1746)  ondansetron (ZOFRAN) injection 4 mg (4 mg Intravenous Given 09/26/21 1514)  fentaNYL (SUBLIMAZE) injection 50 mcg (50 mcg Intravenous Given 09/26/21 1514)  sodium chloride 0.9 % bolus 1,000 mL (0 mLs Intravenous Stopped 09/26/21 2009)  prochlorperazine (COMPAZINE) injection 5 mg (5 mg Intravenous Given 09/26/21 1642)    ED Course/ Medical Decision Making/ A&P                           Medical Decision Making Problems Addressed: Dehydration: acute illness or injury Generalized abdominal pain: chronic illness or injury Nausea vomiting and diarrhea: acute illness or injury that poses a threat to life or bodily functions  Amount and/or Complexity of Data Reviewed Labs: ordered.  Risk Prescription drug management.   Patient abdominal pain.  Returns after recent visit.  Has CT scan couple days ago.  Reassuring at that time.  Started on antibiotics.  States she has been able to keep medicines down.  Has been throwing up and having diarrhea.  Lab work ordered and interpreted.  Reassuring white count.  Does have bicarb at 19.  Initial urinalysis showed potential infection but did have squamous cells indicating could be contaminated.  Reordered with in and out cath and no longer showed infection.  Reviewed CT scan from 2 days ago.  Doubt return of colitis at this time.  Feels better after treatment.  Already has  GI follow-up.  Will discharge home.        Final Clinical Impression(s) / ED Diagnoses Final diagnoses:  Generalized abdominal pain  Nausea vomiting and diarrhea  Dehydration    Rx / DC Orders ED Discharge Orders          Ordered    ondansetron (ZOFRAN) 4 MG tablet  Every 8 hours PRN        09/26/21 1956    prochlorperazine (COMPAZINE) 10 MG tablet  2 times daily PRN        09/26/21 1956              Davonna Belling, MD 09/28/21 1538

## 2023-02-20 IMAGING — CT CT ABD-PELV W/ CM
2 of 4 series · 16 of 46 positions shown, 18 images · IV contrast (Omnipaque)
Comparison: Abdominopelvic CT 07/09/2021

CLINICAL DATA: Right lower quadrant pain.  Diarrhea and vomiting.

EXAM:
CT ABDOMEN AND PELVIS WITH CONTRAST
TECHNIQUE: Multidetector CT imaging of the abdomen and pelvis was performed
using the standard protocol following bolus administration of
intravenous contrast.

[Series 2: axial st · axial · 0.83mm/px · z∈[-412,+43]mm · 13 of 99 slices shown, 15 images]
[im 4/99  soft-tissue]
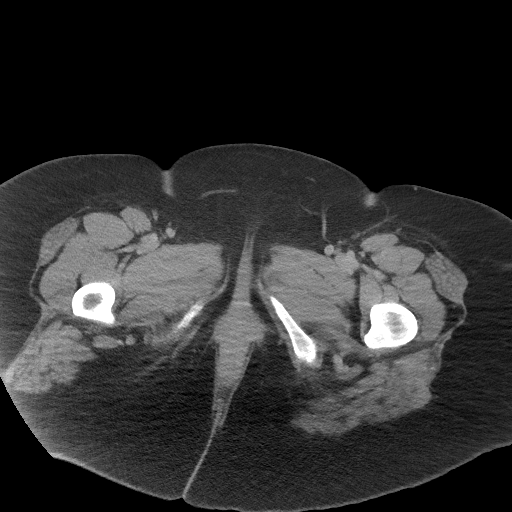
[im 4/99  bone]
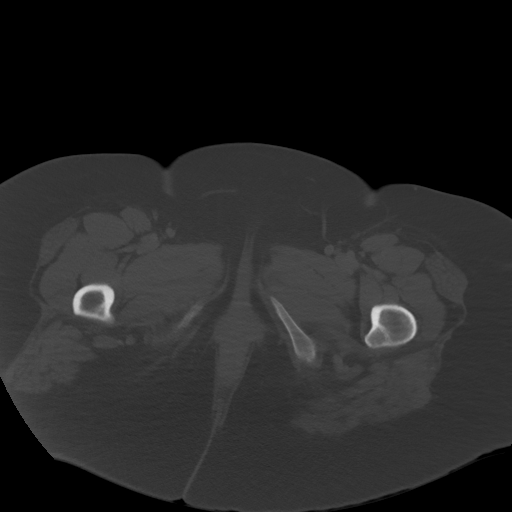
[im 12/99  soft-tissue]
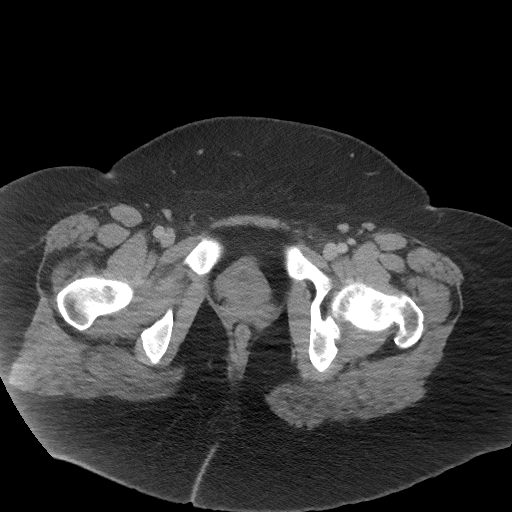
[im 20/99  soft-tissue]
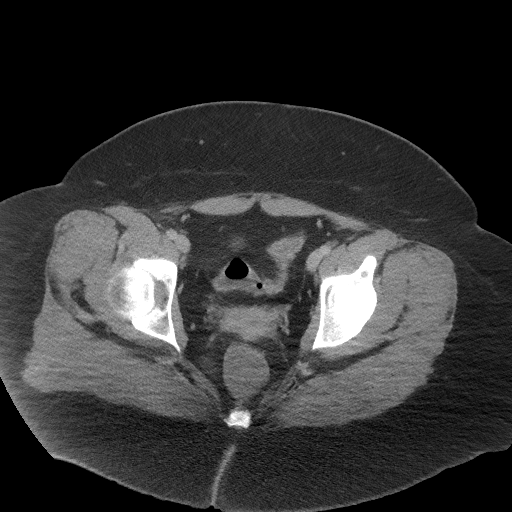
[im 28/99  soft-tissue]
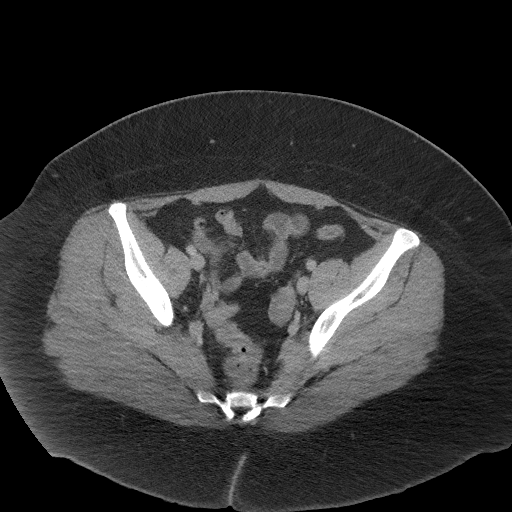
[im 36/99  soft-tissue]
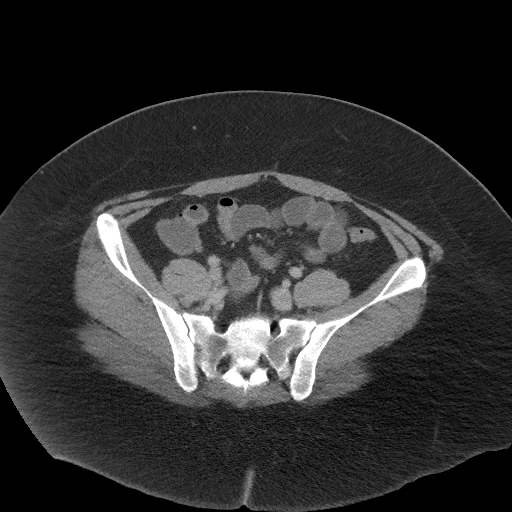
[im 44/99  soft-tissue]
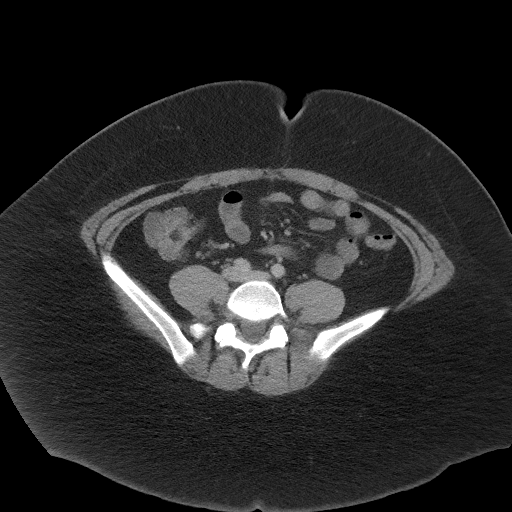
[im 51/99  soft-tissue]
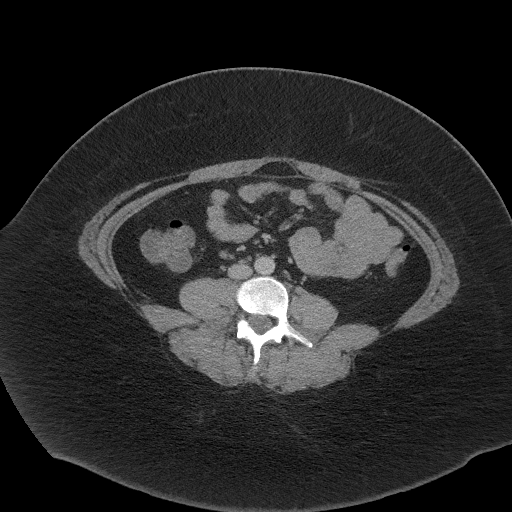
[im 55/99  soft-tissue]
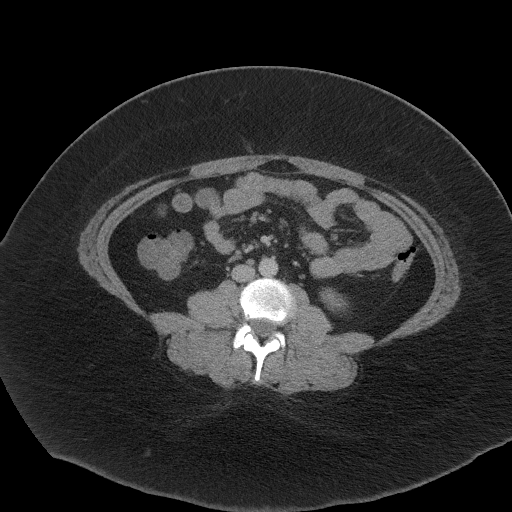
[im 63/99  soft-tissue]
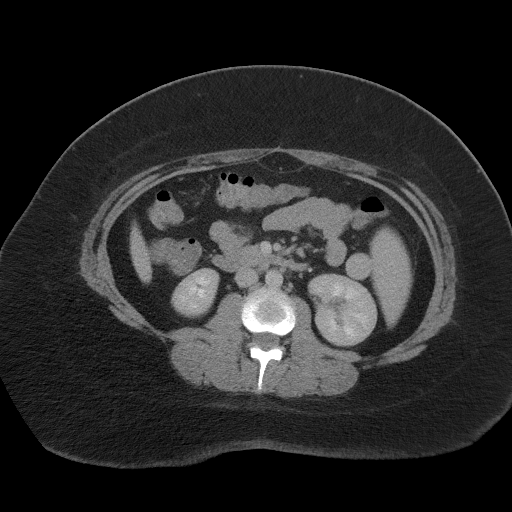
[im 63/99  bone]
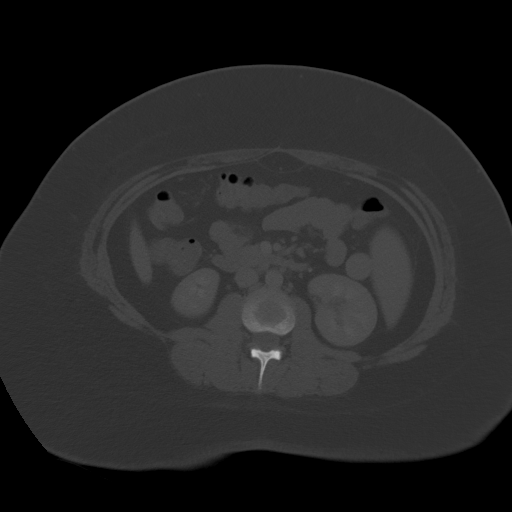
[im 71/99  soft-tissue]
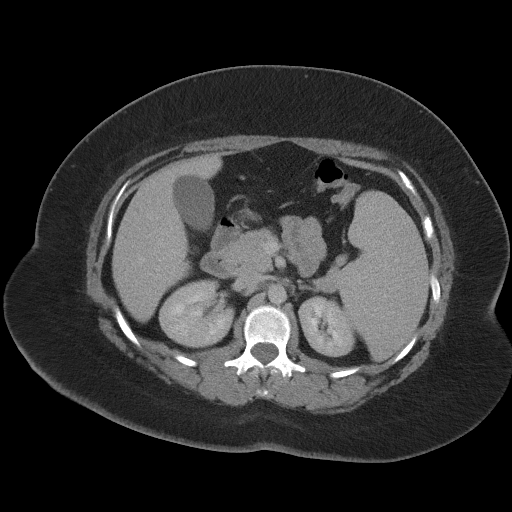
[im 79/99  soft-tissue]
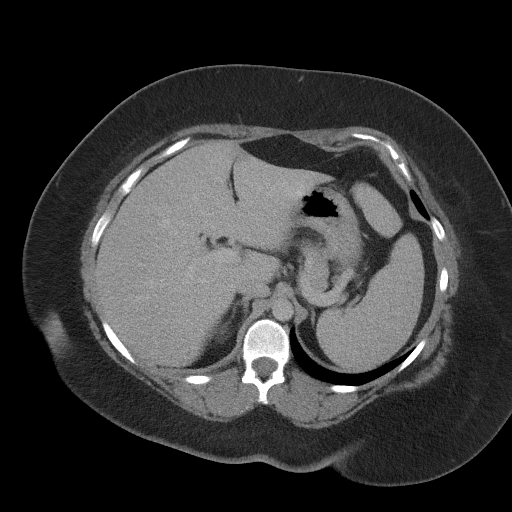
[im 87/99  soft-tissue]
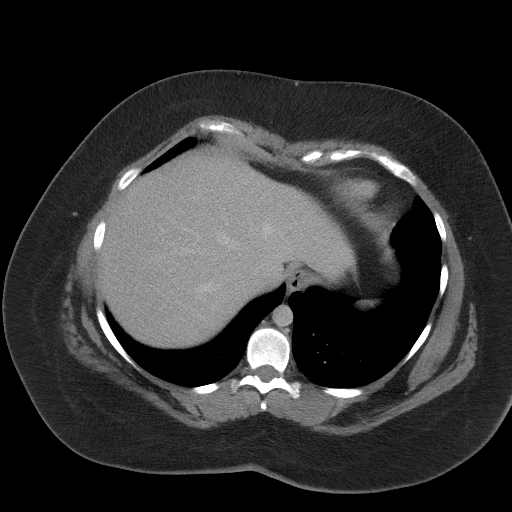
[im 95/99  soft-tissue]
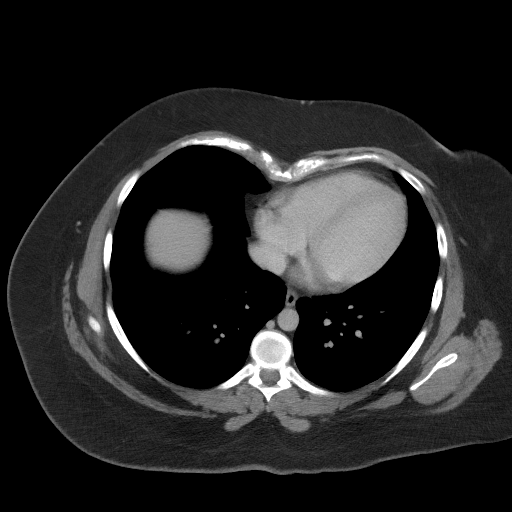

[Series 5: coronal st · coronal · 0.96mm/px · 3 of 102 slices shown]
[im 34/102  soft-tissue]
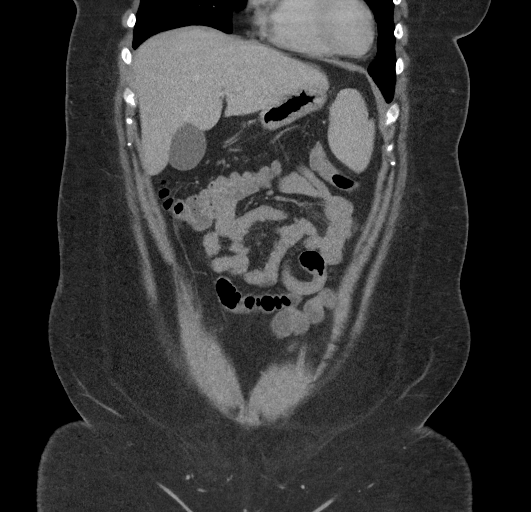
[im 45/102  soft-tissue]
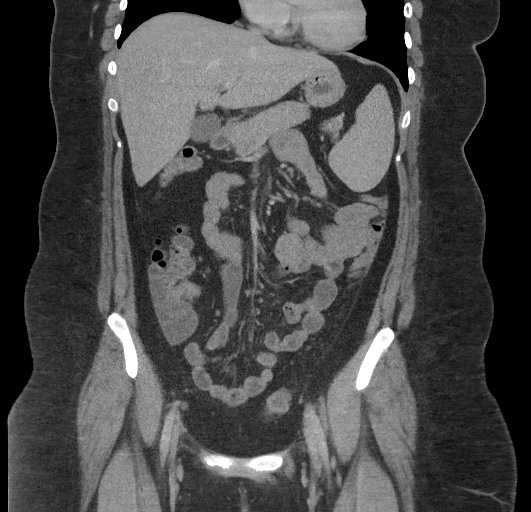
[im 57/102  soft-tissue]
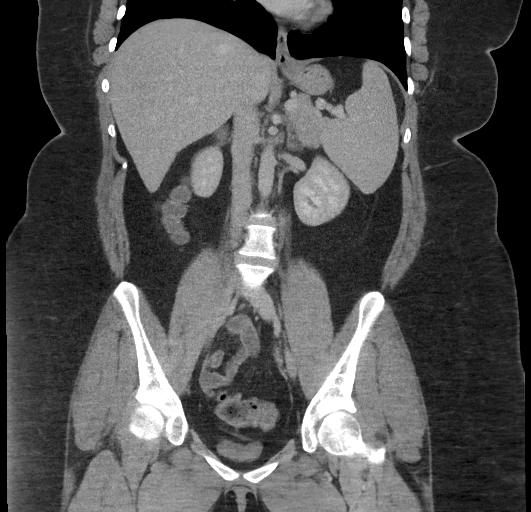

[16 of 46 positions shown; findings below may reference images not displayed]

RADIATION DOSE REDUCTION: This exam was performed according to the
departmental dose-optimization program which includes automated
exposure control, adjustment of the mA and/or kV according to
patient size and/or use of iterative reconstruction technique.

CONTRAST:  100mL OMNIPAQUE IOHEXOL 300 MG/ML  SOLN
FINDINGS: Lower chest: No acute airspace disease or pleural effusion.

Hepatobiliary: Fatty infiltration adjacent to the falciform
ligament. Mild diffuse hepatic steatosis again seen. No suspicious
liver lesion. Gallbladder physiologically distended, no calcified
stone. No biliary dilatation.

Pancreas: No ductal dilatation or inflammation.

Spleen: Again seen splenomegaly, spleen measures 14.7 x 7.3 x
cm (volume = 680 cm^3). Splenule adjacent to the anteromedial
spleen.

Adrenals/Urinary Tract: Normal adrenal glands. No hydronephrosis or
perinephric edema. No visualized renal calculi. The urinary bladder
is completely nondistended and not well assessed.

Stomach/Bowel: Detailed bowel assessment is limited in the absence
of enteric contrast. The stomach is nondistended. There is no small
bowel obstruction or inflammatory change. Occasional fluid-filled
loops of small bowel in the pelvis. There is fluid within the
ascending and rectosigmoid colon. Intervening colon is nondistended.
No definite colonic wall thickening. Normal appendix, best
appreciated on coronal reformat series 5 images 50 through 54. No
terminal ileal inflammation. Diverticulosis on prior exam not well
demonstrated.

Vascular/Lymphatic: Few persistent prominent ileocolic nodes,
greatest 8 mm. Overall mild decrease in mesenteric lymph nodes from
prior exam. No new or progressive adenopathy. Normal caliber
abdominal aorta. Patent portal vein. No portal venous or mesenteric
gas.

Reproductive: Anteverted uterus. Normal appearance of the ovaries.
No adnexal mass.

Other: No ascites, free air, or focal fluid collection.

Musculoskeletal: There are no acute or suspicious osseous
abnormalities.
IMPRESSION: 1. Normal appendix.
2. Fluid-filled loops of small bowel in the pelvis, fluid within the
ascending and rectosigmoid colon. Findings are typical of diarrheal
illness. The colonic wall thickening on prior exam has improved.
3. Persistent but improving prominent mesenteric lymph nodes.
4. Unchanged splenomegaly. Mild hepatic steatosis.

## 2023-10-09 ENCOUNTER — Other Ambulatory Visit: Payer: Self-pay

## 2023-10-09 ENCOUNTER — Emergency Department (HOSPITAL_BASED_OUTPATIENT_CLINIC_OR_DEPARTMENT_OTHER): Payer: BC Managed Care – PPO

## 2023-10-09 ENCOUNTER — Emergency Department (HOSPITAL_BASED_OUTPATIENT_CLINIC_OR_DEPARTMENT_OTHER)
Admission: EM | Admit: 2023-10-09 | Discharge: 2023-10-09 | Disposition: A | Discharge status: Home / Self Care | Payer: BC Managed Care – PPO | Attending: Emergency Medicine | Admitting: Emergency Medicine

## 2023-10-09 DIAGNOSIS — J101 Influenza due to other identified influenza virus with other respiratory manifestations: Secondary | ICD-10-CM | POA: Diagnosis not present

## 2023-10-09 DIAGNOSIS — R059 Cough, unspecified: Secondary | ICD-10-CM | POA: Diagnosis present

## 2023-10-09 LAB — RESP PANEL BY RT-PCR (RSV, FLU A&B, COVID)  RVPGX2
Influenza A by PCR: POSITIVE — AB
Influenza B by PCR: NEGATIVE
Resp Syncytial Virus by PCR: NEGATIVE
SARS Coronavirus 2 by RT PCR: NEGATIVE

## 2023-10-09 MED ORDER — GUAIFENESIN-CODEINE 100-10 MG/5ML PO SOLN
5.0000 mL | Freq: Three times a day (TID) | ORAL | 0 refills | Status: AC | PRN
Start: 2023-10-09 — End: ?

## 2023-10-09 MED ORDER — ALBUTEROL SULFATE HFA 108 (90 BASE) MCG/ACT IN AERS: 2.0000 | INHALATION_SPRAY | RESPIRATORY_TRACT | Status: DC | PRN

## 2023-10-09 MED ORDER — HYDROCOD POLI-CHLORPHE POLI ER 10-8 MG/5ML PO SUER
5.0000 mL | Freq: Once | ORAL | Status: AC
  Administered 2023-10-09: 5 mL via ORAL
  Filled 2023-10-09: qty 5

## 2023-10-09 NOTE — ED Notes (Signed)
Discharge instructions reviewed with patient. Patient verbalizes understanding, no further questions at this time. Medications/prescriptions and follow up information provided. No acute distress noted at time of departure. Mother driving patient home

## 2023-10-09 NOTE — ED Provider Notes (Signed)
[facility] Provider Note   CSN: [id number] Arrival date & time: 10/09/23  1525     History  Chief Complaint  Patient presents with   Influenza    Meredith Beasley is a 28 y.o. female.  Sore throat since last week with right ear infection currently being treated with Amoxicillin. Fever, cough, SOB for the past 3 days. Took a home influenza test that was positive. No vomiting. She is using her nebulizer without much help with cough.   The history is provided by the patient. No language interpreter was used.  Influenza      Home Medications Prior to Admission medications   Medication Sig Start Date End Date Taking? Authorizing Provider  guaiFENesin-codeine 100-10 MG/5ML syrup Take 5 mLs by mouth 3 (three) times daily as needed for cough. 10/09/23  Yes Elpidio Anis, PA-C  amoxicillin-clavulanate (AUGMENTIN) 875-125 MG tablet Take 1 tablet by mouth every 12 (twelve) hours. 09/24/21   Khatri, Hina, PA-C  apixaban (ELIQUIS) 5 MG TABS tablet Take by mouth. 01/28/21   [provider]  busPIRone (BUSPAR) 15 MG tablet Take 1 tablet by mouth 2 (two) times daily. 03/11/21   [provider]  dicyclomine (BENTYL) 20 MG tablet Take 1 tablet (20 mg total) by mouth as needed for spasms (for stomach cramps). 07/09/21   Al Decant, PA-C  escitalopram (LEXAPRO) 20 MG tablet Take by mouth. 07/03/19   [provider]  medroxyPROGESTERone Acetate 150 MG/ML SUSY INJECT 1 ML INTRAMUSCULARLY EVERY 3 MONTHS 02/08/21   [provider]  ondansetron (ZOFRAN) 4 MG tablet Take 1 tablet (4 mg total) by mouth every 8 (eight) hours as needed for nausea or vomiting. 09/26/21   Benjiman Core, MD  prochlorperazine (COMPAZINE) 10 MG tablet Take 1 tablet (10 mg total) by mouth 2 (two) times daily as needed for nausea or vomiting. 09/26/21   Benjiman Core, MD  promethazine (PHENERGAN) 25 MG tablet Take 1 tablet (25 mg total) by mouth  every 6 (six) hours as needed for nausea or vomiting. 09/24/21   Khatri, Hina, PA-C  traMADol (ULTRAM) 50 MG tablet Take 1 tablet (50 mg total) by mouth as needed (for pain). 07/09/21   Al Decant, PA-C      Allergies    Sumatriptan    Review of Systems   Review of Systems  Physical Exam Updated Vital Signs BP 112/81 (BP Location: Left Arm)   Pulse 98   Temp 99 F (37.2 C)   Resp 18   Ht 5\' 7"  (1.702 m)   Wt (!) 154.2 kg   SpO2 100%   BMI 53.25 kg/m  Physical Exam Vitals and nursing note reviewed.  Constitutional:      Appearance: Normal appearance. She is well-developed.  HENT:     Head: Normocephalic.     Right Ear: Tympanic membrane normal.     Left Ear: Tympanic membrane normal.     Nose: Mucosal edema present.     Mouth/Throat:     Mouth: Mucous membranes are moist.     Pharynx: No oropharyngeal exudate or posterior oropharyngeal erythema.  Cardiovascular:     Rate and Rhythm: Normal rate and regular rhythm.     Heart sounds: No murmur heard. Pulmonary:     Effort: Pulmonary effort is normal.     Breath sounds: Normal breath sounds. No wheezing, rhonchi or rales.     Comments: Active, mild cough Musculoskeletal:  General: Normal range of motion.     Cervical back: Normal range of motion and neck supple.  Skin:    General: Skin is warm and dry.     Findings: No rash.  Neurological:     Mental Status: She is alert and oriented to person, place, and time.     ED Results / Procedures / Treatments   Labs (all labs ordered are listed, but only abnormal results are displayed) Labs Reviewed  RESP PANEL BY RT-PCR (RSV, FLU A&B, COVID)  [id number] - Abnormal; Notable for the following components:      Result Value   Influenza A by PCR POSITIVE (*)    All other components within normal limits    EKG None  Radiology DG Chest 2 View Result Date: 10/09/2023 CLINICAL DATA:  Shortness of breath.  Cough. EXAM: CHEST - 2 VIEW COMPARISON:  11/27/2008.  FINDINGS: Low lung volume. Bilateral lung fields are clear. Bilateral costophrenic angles are clear. Normal cardio-mediastinal silhouette. No acute osseous abnormalities. The soft tissues are within normal limits. IMPRESSION: No active cardiopulmonary disease. Electronically Signed   By: Jules Schick M.D.   On: 10/09/2023 16:31    Procedures Procedures    Medications Ordered in ED Medications  albuterol (VENTOLIN HFA) 108 (90 Base) MCG/ACT inhaler 2 puff (has no administration in time range)  chlorpheniramine-HYDROcodone (TUSSIONEX) 10-8 MG/5ML suspension 5 mL (has no administration in time range)    ED Course/ Medical Decision Making/ A&P Clinical Course as of 10/09/23 1726  Mon Oct 09, 2023  1716 Symptoms of URI developing over the last week. 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 On Amoxil for ear infection since last week. CXR negative. Flu A+ in ED. VSS. Will treat cough, continue nebs at home if it improves symptoms. Follow up with PCP for recheck if symptoms persist.  [SU]    Clinical Course User Index [SU] Elpidio Anis, PA-C                                 Medical Decision Making Amount and/or Complexity of Data Reviewed Radiology: ordered.  Risk Prescription drug management.           Final Clinical Impression(s) / ED Diagnoses Final diagnoses:  Influenza A    Rx / DC Orders ED Discharge Orders          Ordered    guaiFENesin-codeine 100-10 MG/5ML syrup  3 times daily PRN        10/09/23 1725              Elpidio Anis, PA-C 10/09/23 1726    Vanetta Mulders, MD 10/11/23 [phone]

## 2023-10-09 NOTE — ED Triage Notes (Signed)
Pt reports that she did an at home flu test on Saturday and she tested + for FLU A. Reports that when she is sick like this she ends up with pneumonia. States that she is also on an antibiotic for an ear infection. States that she has been using a neb and it not working. Reports SOB

## 2023-10-09 NOTE — Discharge Instructions (Signed)
Take Tylenol and/or ibuprofen for aches and any fever. Drink plenty of fluids and get lots of rest. Continue your Amoxicillin as prescribed for your ear infection until gone.   Take guaifenesin and codeine for cough as prescribed. Continue your nebulizer use if this provides any symptomatic relief.
